# Patient Record
Sex: Female | Born: 2005 | Race: White | Hispanic: No | Marital: Single | Smoking: Never smoker
Health system: Southern US, Community
[De-identification: ages and names within clinical notes are randomized; demographics above are authoritative.]

## PROBLEM LIST (undated history)

## (undated) DIAGNOSIS — T7840XA Allergy, unspecified, initial encounter: Secondary | ICD-10-CM

## (undated) DIAGNOSIS — K5904 Chronic idiopathic constipation: Secondary | ICD-10-CM

## (undated) DIAGNOSIS — N946 Dysmenorrhea, unspecified: Secondary | ICD-10-CM

## (undated) HISTORY — DX: Chronic idiopathic constipation: K59.04

## (undated) HISTORY — DX: Allergy, unspecified, initial encounter: T78.40XA

## (undated) HISTORY — DX: Dysmenorrhea, unspecified: N94.6

---
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
## 2023-01-06 DIAGNOSIS — Z01419 Encounter for gynecological examination (general) (routine) without abnormal findings: Secondary | ICD-10-CM | POA: Diagnosis not present

## 2023-01-06 DIAGNOSIS — Z6824 Body mass index (BMI) 24.0-24.9, adult: Secondary | ICD-10-CM | POA: Diagnosis not present

## 2023-01-06 DIAGNOSIS — Z113 Encounter for screening for infections with a predominantly sexual mode of transmission: Secondary | ICD-10-CM | POA: Diagnosis not present

## 2023-02-21 ENCOUNTER — Ambulatory Visit: Payer: BC Managed Care – PPO | Admitting: Pediatrics

## 2023-02-21 ENCOUNTER — Telehealth: Payer: Self-pay | Admitting: Pediatrics

## 2023-02-21 DIAGNOSIS — Z23 Encounter for immunization: Secondary | ICD-10-CM

## 2023-02-21 NOTE — Telephone Encounter (Signed)
Mother called to reschedule appointment for today. Mother stated she was on hold for 20 minutes and tried to call earlier to reschedule but was unable to get through. Mother did not specify a reason for missing appointment today, but was notified of the no-show policy. Appointment was rescheduled for 8/6.

## 2023-03-01 ENCOUNTER — Ambulatory Visit: Payer: BC Managed Care – PPO | Admitting: Pediatrics

## 2023-03-01 DIAGNOSIS — Z23 Encounter for immunization: Secondary | ICD-10-CM

## 2023-03-01 NOTE — Progress Notes (Signed)
MenB vaccine per orders. Indications, contraindications and side effects of vaccine/vaccines discussed with parent and parent verbally expressed understanding and also agreed with the administration of vaccine/vaccines as ordered above today.Handout (VIS) given for each vaccine at this visit.  

## 2023-03-01 NOTE — Patient Instructions (Signed)
Have an amazing freshman year at [employer]!

## 2023-03-11 DIAGNOSIS — H66001 Acute suppurative otitis media without spontaneous rupture of ear drum, right ear: Secondary | ICD-10-CM | POA: Diagnosis not present

## 2023-07-26 ENCOUNTER — Encounter: Payer: Self-pay | Admitting: Pediatrics

## 2023-07-26 ENCOUNTER — Ambulatory Visit (INDEPENDENT_AMBULATORY_CARE_PROVIDER_SITE_OTHER): Payer: BC Managed Care – PPO | Admitting: Pediatrics

## 2023-07-26 VITALS — Temp 97.8°F | Wt 131.3 lb

## 2023-07-26 DIAGNOSIS — J069 Acute upper respiratory infection, unspecified: Secondary | ICD-10-CM | POA: Diagnosis not present

## 2023-07-26 DIAGNOSIS — H6692 Otitis media, unspecified, left ear: Secondary | ICD-10-CM | POA: Diagnosis not present

## 2023-07-26 MED ORDER — CEFDINIR 300 MG PO CAPS: 300.0000 mg | ORAL_CAPSULE | Freq: Two times a day (BID) | ORAL | 0 refills | Status: DC

## 2023-07-26 MED ORDER — HYDROXYZINE HCL 25 MG PO TABS: 25.0000 mg | ORAL_TABLET | Freq: Every evening | ORAL | 0 refills | Status: AC | PRN

## 2023-07-26 NOTE — Patient Instructions (Signed)
 Otitis Media, Adult    Otitis media is a condition in which the middle ear is red and swollen (inflamed) and full of fluid. The middle ear is the part of the ear that contains bones for hearing as well as air that helps send sounds to the brain. The condition usually goes away on its own.  What are the causes?  This condition is caused by a blockage in the eustachian tube. This tube connects the middle ear to the back of the nose. It normally allows air into the middle ear. The blockage is caused by fluid or swelling. Problems that can cause blockage include:  A cold or infection that affects the nose, mouth, or throat.  Allergies.  An irritant, such as tobacco smoke.  Adenoids that have become large. The adenoids are soft tissue located in the back of the throat, behind the nose and the roof of the mouth.  Growth or swelling in the upper part of the throat, just behind the nose (nasopharynx).  Damage to the ear caused by a change in pressure. This is called barotrauma.  What increases the risk?  You are more likely to develop this condition if you:  Smoke or are exposed to tobacco smoke.  Have an opening in the roof of your mouth (cleft palate).  Have acid reflux.  Have problems in your body's defense system (immune system).  What are the signs or symptoms?  Symptoms of this condition include:  Ear pain.  Fever.  Problems with hearing.  Being tired.  Fluid leaking from the ear.  Ringing in the ear.  How is this treated?  This condition can go away on its own within 3-5 days. But if the condition is caused by germs (bacteria) and does not go away on its own, or if it keeps coming back, your doctor may:  Give you antibiotic medicines.  Give you medicines for pain.  Follow these instructions at home:  Take over-the-counter and prescription medicines only as told by your doctor.  If you were prescribed an antibiotic medicine, take it as told by your doctor. Do not stop taking it even if you start to feel better.  Keep  all follow-up visits.  Contact a doctor if:  You have bleeding from your nose.  There is a lump on your neck.  You are not feeling better in 5 days.  You feel worse instead of better.  Get help right away if:  You have pain that is not helped with medicine.  You have swelling, redness, or pain around your ear.  You get a stiff neck.  You cannot move part of your face (paralysis).  You notice that the bone behind your ear hurts when you touch it.  You get a very bad headache.  Summary  Otitis media means that the middle ear is red, swollen, and full of fluid.  This condition usually goes away on its own.  If the problem does not go away, treatment may be needed. You may be given medicines to treat the infection or to treat your pain.  If you were prescribed an antibiotic medicine, take it as told by your doctor. Do not stop taking it even if you start to feel better.  Keep all follow-up visits.  This information is not intended to replace advice given to you by your health care provider. Make sure you discuss any questions you have with your health care provider.  Document Revised: 10/20/2020 Document Reviewed: 10/20/2020  Elsevier  Patient Education  2024 [employer].

## 2023-07-26 NOTE — Progress Notes (Signed)
  Subjective:     History was provided by the patient and mother. Tara Barber is a 17 y.o. female who presents with possible ear infection. Symptoms include bilateral ear pain, headache, congestion x 2 nights. Headache was worse yesterday- has improved slightly. Describes severe ear pressure and pain in both ears. No fevers. Patient denies increased work of breathing, wheezing, vomiting, diarrhea, rashes.  Recent ear infections: no. Known reaction to Amoxicillin . No known sick contacts.  The patient's history has been marked as reviewed and updated as appropriate.  Review of Systems Pertinent items are noted in HPI   Objective:   Vitals:   07/26/23 1045  Temp: 97.8 F (36.6 C)   General:   alert, cooperative, appears stated age, and no distress  Oropharynx:  lips, mucosa, and tongue normal; teeth and gums normal   Eyes:   conjunctivae/corneas clear. PERRL, EOM's intact. Fundi benign.   Ears:   abnormal TM right ear - serous middle ear fluid and abnormal TM left ear - erythematous, dull, bulging, and serous middle ear fluid  Nose: clear rhinorrhea  Neck:  no adenopathy, supple, symmetrical, trachea midline, and thyroid  not enlarged, symmetric, no tenderness/mass/nodules  Lung:  clear to auscultation bilaterally  Heart:   regular rate and rhythm, S1, S2 normal, no murmur, click, rub or gallop  Abdomen:  soft, non-tender; bowel sounds normal; no masses,  no organomegaly  Extremities:  extremities normal, atraumatic, no cyanosis or edema  Skin:  Warm and dry  Neurological:   Negative     Assessment:   URI with cough and congestion  Acute left Otitis media   Plan:  Cefdinir  as ordered for otitis media Hydroxyzine  as ordered for associated cough/congestion Supportive therapy for pain management Return precautions provided Follow-up as needed for symptoms that worsen/fail to improve  Meds ordered this encounter  Medications   cefdinir  (OMNICEF ) 300 MG capsule    Sig: Take 1  capsule (300 mg total) by mouth 2 (two) times daily for 10 days.    Dispense:  20 capsule    Refill:  0   hydrOXYzine  (ATARAX ) 25 MG tablet    Sig: Take 1 tablet (25 mg total) by mouth at bedtime as needed.    Dispense:  30 tablet    Refill:  0

## 2023-07-29 ENCOUNTER — Ambulatory Visit (INDEPENDENT_AMBULATORY_CARE_PROVIDER_SITE_OTHER): Payer: BC Managed Care – PPO | Admitting: Pediatrics

## 2023-07-29 VITALS — Wt 131.4 lb

## 2023-07-29 DIAGNOSIS — H6691 Otitis media, unspecified, right ear: Secondary | ICD-10-CM | POA: Diagnosis not present

## 2023-07-29 DIAGNOSIS — H9313 Tinnitus, bilateral: Secondary | ICD-10-CM

## 2023-07-29 DIAGNOSIS — H9202 Otalgia, left ear: Secondary | ICD-10-CM | POA: Diagnosis not present

## 2023-07-29 MED ORDER — AZITHROMYCIN 250 MG PO TABS: ORAL_TABLET | ORAL | 0 refills | Status: AC

## 2023-07-29 NOTE — Progress Notes (Signed)
 History provided by patient and mother. Tara Barber was seen in the office 3 days ago and treated for acute otitis media in left ear. She continues to have ear pain. New symptoms include tinnitus, diminished hearing in both ears, sore throat, pain in her upper jaw. She has not had any fevers.     Review of Systems  Constitutional:  Negative for  appetite change.  HENT:  Negative for nasal and ear discharge.  Positive for ear pain, decreased hearing Eyes: Negative for discharge, redness and itching.  Respiratory:  Negative for cough and wheezing.   Cardiovascular: Negative.  Gastrointestinal: Negative for vomiting and diarrhea.  Musculoskeletal: Negative for arthralgias.  Skin: Negative for rash.  Neurological: Negative       Objective:   Physical Exam  Constitutional: Appears well-developed and well-nourished.   HENT:  Ears: Left TM normal, landmarks visible, serous fluid behind TM, Right TM dull, erythematous, bulging Nose: No nasal discharge.  Mouth/Throat: Mucous membranes are moist. .  Eyes: Pupils are equal, round, and reactive to light.  Neck: Normal range of motion..  Cardiovascular: Regular rhythm.  No murmur heard. Pulmonary/Chest: Effort normal and breath sounds normal. No wheezes with  no retractions.  Abdominal: Soft. Bowel sounds are normal. No distension and no tenderness.  Musculoskeletal: Normal range of motion.  Neurological: Active and alert.  Skin: Skin is warm and moist. No rash noted.       Assessment:      Acute otitis media, right ear Tinnitus Serous otitis, left ear Otalgia, left ear  Plan:   Antibiotics changed to Azithromycin     Recommended antihistamines at bedtime to help dry up nasal congestion Follow as needed

## 2023-07-29 NOTE — Patient Instructions (Signed)
 Azithromycin - 2 tablets once today then 1 tablet once a day for days 2-5 Hydroxyzine  at bedtime Warm salt water gargles and/or hot tea with honey Drink plenty of water Follow up as needed  At [facility] we value your feedback. You may receive a survey about your visit today. Please share your experience as we strive to create trusting relationships with our patients to provide genuine, compassionate, quality care.

## 2023-08-01 ENCOUNTER — Encounter: Payer: Self-pay | Admitting: Pediatrics

## 2023-08-01 DIAGNOSIS — H9313 Tinnitus, bilateral: Secondary | ICD-10-CM | POA: Insufficient documentation

## 2023-08-01 DIAGNOSIS — H9202 Otalgia, left ear: Secondary | ICD-10-CM | POA: Insufficient documentation

## 2023-12-09 DIAGNOSIS — Z01419 Encounter for gynecological examination (general) (routine) without abnormal findings: Secondary | ICD-10-CM | POA: Diagnosis not present

## 2023-12-09 DIAGNOSIS — Z113 Encounter for screening for infections with a predominantly sexual mode of transmission: Secondary | ICD-10-CM | POA: Diagnosis not present

## 2023-12-30 ENCOUNTER — Ambulatory Visit (INDEPENDENT_AMBULATORY_CARE_PROVIDER_SITE_OTHER): Admitting: Pediatrics

## 2023-12-30 ENCOUNTER — Encounter: Payer: Self-pay | Admitting: Pediatrics

## 2023-12-30 VITALS — BP 114/68 | Ht 61.2 in | Wt 142.0 lb

## 2023-12-30 DIAGNOSIS — Z0000 Encounter for general adult medical examination without abnormal findings: Secondary | ICD-10-CM

## 2023-12-30 DIAGNOSIS — Z68.41 Body mass index (BMI) pediatric, 85th percentile to less than 95th percentile for age: Secondary | ICD-10-CM

## 2023-12-30 DIAGNOSIS — Z1339 Encounter for screening examination for other mental health and behavioral disorders: Secondary | ICD-10-CM

## 2023-12-30 NOTE — Patient Instructions (Signed)
 At [facility] we value your feedback. You may receive a survey about your visit today. Please share your experience as we strive to create trusting relationships with our patients to provide genuine, compassionate, quality care.   Preventive Care 20-20 Years Old, Female Preventive care refers to lifestyle choices and visits with your health care provider that can promote health and wellness. At this stage in your life, you may start seeing a primary care physician instead of a pediatrician for your preventive care. Preventive care visits are also called wellness exams. What can I expect for my preventive care visit? Counseling During your preventive care visit, your health care provider may ask about your: Medical history, including: Past medical problems. Family medical history. Pregnancy history. Current health, including: Menstrual cycle. Method of birth control. Emotional well-being. Home life and relationship well-being. Sexual activity and sexual health. Lifestyle, including: Alcohol, nicotine or tobacco, and drug use. Access to firearms. Diet, exercise, and sleep habits. Sunscreen use. Motor vehicle safety. Physical exam Your health care provider may check your: Height and weight. These may be used to calculate your BMI (body mass index). BMI is a measurement that tells if you are at a healthy weight. Waist circumference. This measures the distance around your waistline. This measurement also tells if you are at a healthy weight and may help predict your risk of certain diseases, such as type 2 diabetes and high blood pressure. Heart rate and blood pressure. Body temperature. Skin for abnormal spots. Breasts. What immunizations do I need? Vaccines are usually given at various ages, according to a schedule. Your health care provider will recommend vaccines for you based on your age, medical history, and lifestyle or other factors, such as travel or where you  work. What tests do I need? Screening Your health care provider may recommend screening tests for certain conditions. This may include: Vision and hearing tests. Lipid and cholesterol levels. Pelvic exam and Pap test. Hepatitis B test. Hepatitis C test. HIV (human immunodeficiency virus) test. STI (sexually transmitted infection) testing, if you are at risk. Tuberculosis skin test if you have symptoms. BRCA-related cancer screening. This may be done if you have a family history of breast, ovarian, tubal, or peritoneal cancers. Talk with your health care provider about your test results, treatment options, and if necessary, the need for more tests. Follow these instructions at home: Eating and drinking Eat a healthy diet that includes fresh fruits and vegetables, whole grains, lean protein, and low-fat dairy products. Drink enough fluid to keep your urine pale yellow. Do not drink alcohol if: Your health care provider tells you not to drink. You are pregnant, may be pregnant, or are planning to become pregnant. You are under the legal drinking age. In the [place] the legal drinking age is 40. If you drink alcohol: Limit how much you have to 0-1 drink a day. Know how much alcohol is in your drink. In the [place] one drink equals one 12 oz bottle of beer (355 mL), one 5 oz glass of wine (148 mL), or one 1 oz glass of hard liquor (44 mL). Lifestyle Brush your teeth every morning and night with fluoride toothpaste. Floss one time each day. Exercise for at least 30 minutes 5 or more days of the week. Do not use any products that contain nicotine or tobacco. These products include cigarettes, chewing tobacco, and vaping devices, such as e-cigarettes. If you need help quitting, ask your health care provider. Do not use drugs. If you are  sexually active, practice safe sex. Use a condom or other form of protection to prevent STIs. If you do not wish to become pregnant, use a form of birth control.  If you plan to become pregnant, see your health care provider for a prepregnancy visit. Find healthy ways to manage stress, such as: Meditation, yoga, or listening to music. Journaling. Talking to a trusted person. Spending time with friends and family. Safety Always wear your seat belt while driving or riding in a vehicle. Do not drive: If you have been drinking alcohol. Do not ride with someone who has been drinking. When you are tired or distracted. While texting. If you have been using any mind-altering substances or drugs. Wear a helmet and other protective equipment during sports activities. If you have firearms in your house, make sure you follow all gun safety procedures. Seek help if you have been bullied, physically abused, or sexually abused. Use the internet responsibly to avoid dangers, such as online bullying and online sex predators. What's next? Go to your health care provider once a year for an annual wellness visit. Ask your health care provider how often you should have your eyes and teeth checked. Stay up to date on all vaccines. This information is not intended to replace advice given to you by your health care provider. Make sure you discuss any questions you have with your health care provider. Document Revised: 01/07/2021 Document Reviewed: 01/07/2021 Elsevier Patient Education  2024 [employer].

## 2023-12-30 NOTE — Progress Notes (Signed)
 Subjective:     Tara Barber is a 18 y.o. female and is here for a comprehensive physical exam. The patient reports no problems.  Social History   Socioeconomic History   Marital status: Single    Spouse name: Not on file   Number of children: Not on file   Years of education: Not on file   Highest education level: Not on file  Occupational History   Not on file  Tobacco Use   Smoking status: Never    Passive exposure: Never   Smokeless tobacco: Never  Vaping Use   Vaping status: Never Used  Substance and Sexual Activity   Alcohol use: No   Drug use: No   Sexual activity: Yes    Partners: Male    Comment: oral sex  Other Topics Concern   Not on file  Social History Narrative   Lives at home Mom, Dad, 2 brothers, and 3 dogs.       12th grade at [employer] (d[employer]), singing   Social Drivers of Health   Financial Resource Strain: Low Risk  (01/18/2019)   Overall Financial Resource Strain (CARDIA)    Difficulty of Paying Living Expenses: Not hard at all  Food Insecurity: Unknown (01/18/2019)   Hunger Vital Sign    Worried About Running Out of Food in the Last Year: Patient declined    Ran Out of Food in the Last Year: Patient declined  Transportation Needs: Unknown (01/18/2019)   PRAPARE - Administrator, Civil Service (Medical): Patient declined    Lack of Transportation (Non-Medical): Patient declined  Physical Activity: Not on file  Stress: Not on file  Social Connections: Not on file  Intimate Partner Violence: Not on file   Health Maintenance  Topic Date Due   CHLAMYDIA SCREENING  Never done   HIV Screening  Never done   Hepatitis C Screening  Never done   COVID-19 Vaccine (3 - 2024-25 season) 03/27/2023   DTaP/Tdap/Td (7 - Td or Tdap) 03/04/2026   Pneumococcal Vaccine 54-82 Years old  Completed   HPV VACCINES  Completed   Meningococcal B Vaccine  Completed   INFLUENZA VACCINE  Discontinued    The following portions of  the patient's history were reviewed and updated as appropriate: allergies, current medications, past family history, past medical history, past social history, past surgical history, and problem list.  Review of Systems Pertinent items are noted in HPI.   Objective:    BP 114/68   Ht 5' 1.2" (1.554 m)   Wt 142 lb (64.4 kg)   BMI 26.66 kg/m  General appearance: alert, cooperative, appears stated age, and no distress Head: Normocephalic, without obvious abnormality, atraumatic Eyes: conjunctivae/corneas clear. PERRL, EOM's intact. Fundi benign. Ears: normal TM's and external ear canals both ears Nose: Nares normal. Septum midline. Mucosa normal. No drainage or sinus tenderness. Throat: lips, mucosa, and tongue normal; teeth and gums normal Neck: no adenopathy, no carotid bruit, no JVD, supple, symmetrical, trachea midline, and thyroid  not enlarged, symmetric, no tenderness/mass/nodules Lungs: clear to auscultation bilaterally Heart: regular rate and rhythm, S1, S2 normal, no murmur, click, rub or gallop and normal apical impulse Abdomen: soft, non-tender; bowel sounds normal; no masses,  no organomegaly Extremities: extremities normal, atraumatic, no cyanosis or edema Pulses: 2+ and symmetric Skin: Skin color, texture, turgor normal. No rashes or lesions Lymph nodes: Cervical, supraclavicular, and axillary nodes normal. Neurologic: Grossly normal    Assessment:  Healthy female exam.      Plan:     See After Visit Summary for Counseling Recommendations

## 2024-01-01 DIAGNOSIS — Z68.41 Body mass index (BMI) pediatric, 85th percentile to less than 95th percentile for age: Secondary | ICD-10-CM | POA: Insufficient documentation
